# Patient Record
Sex: Male | Born: 1990 | Race: White | Hispanic: No | Smoking: Never smoker
Health system: Southern US, Community
[De-identification: ages and names within clinical notes are randomized; demographics above are authoritative.]

---
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
## 2018-05-22 ENCOUNTER — Emergency Department (HOSPITAL_COMMUNITY): Payer: No Typology Code available for payment source

## 2018-05-22 ENCOUNTER — Emergency Department (HOSPITAL_COMMUNITY)
Admission: EM | Admit: 2018-05-22 | Discharge: 2018-05-22 | Disposition: A | Discharge status: Home / Self Care | Payer: No Typology Code available for payment source | Attending: Emergency Medicine | Admitting: Emergency Medicine

## 2018-05-22 ENCOUNTER — Other Ambulatory Visit: Payer: Self-pay

## 2018-05-22 ENCOUNTER — Encounter (HOSPITAL_COMMUNITY): Payer: Self-pay | Admitting: Emergency Medicine

## 2018-05-22 DIAGNOSIS — Y93I9 Activity, other involving external motion: Secondary | ICD-10-CM | POA: Insufficient documentation

## 2018-05-22 DIAGNOSIS — Y998 Other external cause status: Secondary | ICD-10-CM | POA: Diagnosis not present

## 2018-05-22 DIAGNOSIS — Y92410 Unspecified street and highway as the place of occurrence of the external cause: Secondary | ICD-10-CM | POA: Diagnosis not present

## 2018-05-22 DIAGNOSIS — S199XXA Unspecified injury of neck, initial encounter: Secondary | ICD-10-CM | POA: Diagnosis present

## 2018-05-22 DIAGNOSIS — S139XXA Sprain of joints and ligaments of unspecified parts of neck, initial encounter: Secondary | ICD-10-CM | POA: Diagnosis not present

## 2018-05-22 DIAGNOSIS — S20219A Contusion of unspecified front wall of thorax, initial encounter: Secondary | ICD-10-CM

## 2018-05-22 MED ORDER — IBUPROFEN 400 MG PO TABS
400.0000 mg | ORAL_TABLET | Freq: Once | ORAL | Status: AC
  Administered 2018-05-22: 400 mg via ORAL
  Filled 2018-05-22: qty 1

## 2018-05-22 NOTE — Discharge Instructions (Signed)
Apply ice to any sore area for thirty minutes at a time, 3-4 times a day.  Take ibuprofen or naproxen as needed for pain.  For additional pain relief, take acetaminophen along with the ibuprofen or naproxen.
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
## 2018-05-22 NOTE — ED Notes (Signed)
Pt taken to CT and xray
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
## 2019-03-20 ENCOUNTER — Other Ambulatory Visit: Payer: Self-pay

## 2019-03-20 DIAGNOSIS — Z20822 Contact with and (suspected) exposure to covid-19: Secondary | ICD-10-CM

## 2019-03-22 LAB — NOVEL CORONAVIRUS, NAA: SARS-CoV-2, NAA: NOT DETECTED
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
## 2019-07-24 ENCOUNTER — Ambulatory Visit: Payer: Medicaid Other | Attending: Internal Medicine

## 2019-07-24 DIAGNOSIS — Z20822 Contact with and (suspected) exposure to covid-19: Secondary | ICD-10-CM

## 2019-07-25 LAB — NOVEL CORONAVIRUS, NAA: SARS-CoV-2, NAA: NOT DETECTED

## 2019-08-21 ENCOUNTER — Ambulatory Visit: Payer: Medicaid Other | Attending: Internal Medicine

## 2019-08-21 DIAGNOSIS — Z20822 Contact with and (suspected) exposure to covid-19: Secondary | ICD-10-CM

## 2019-08-22 LAB — NOVEL CORONAVIRUS, NAA: SARS-CoV-2, NAA: NOT DETECTED

## 2019-09-04 ENCOUNTER — Ambulatory Visit: Payer: Medicaid Other | Attending: Internal Medicine

## 2019-09-04 DIAGNOSIS — Z20822 Contact with and (suspected) exposure to covid-19: Secondary | ICD-10-CM

## 2019-09-05 LAB — NOVEL CORONAVIRUS, NAA: SARS-CoV-2, NAA: NOT DETECTED

## 2020-03-17 ENCOUNTER — Other Ambulatory Visit: Payer: Self-pay

## 2020-03-17 ENCOUNTER — Other Ambulatory Visit: Payer: Medicaid Other

## 2020-03-17 DIAGNOSIS — Z20822 Contact with and (suspected) exposure to covid-19: Secondary | ICD-10-CM

## 2020-03-18 LAB — NOVEL CORONAVIRUS, NAA: SARS-CoV-2, NAA: NOT DETECTED

## 2020-03-18 LAB — SARS-COV-2, NAA 2 DAY TAT

## 2020-03-24 ENCOUNTER — Other Ambulatory Visit: Payer: Medicaid Other

## 2020-08-25 ENCOUNTER — Other Ambulatory Visit: Payer: BC Managed Care – PPO

## 2020-08-25 ENCOUNTER — Other Ambulatory Visit: Payer: Self-pay

## 2020-08-25 DIAGNOSIS — Z20822 Contact with and (suspected) exposure to covid-19: Secondary | ICD-10-CM

## 2020-08-26 LAB — SARS-COV-2, NAA 2 DAY TAT

## 2020-08-26 LAB — NOVEL CORONAVIRUS, NAA: SARS-CoV-2, NAA: NOT DETECTED
